# Patient Record
Sex: Male | Born: 1960 | Race: White | Hispanic: No | Marital: Single | Smoking: Current every day smoker
Health system: Southern US, Community
[De-identification: ages and names within clinical notes are randomized; demographics above are authoritative.]

## PROBLEM LIST (undated history)

## (undated) DIAGNOSIS — R944 Abnormal results of kidney function studies: Secondary | ICD-10-CM

## (undated) DIAGNOSIS — F419 Anxiety disorder, unspecified: Secondary | ICD-10-CM

## (undated) DIAGNOSIS — D126 Benign neoplasm of colon, unspecified: Secondary | ICD-10-CM

## (undated) DIAGNOSIS — I10 Essential (primary) hypertension: Secondary | ICD-10-CM

## (undated) DIAGNOSIS — T7840XA Allergy, unspecified, initial encounter: Secondary | ICD-10-CM

## (undated) DIAGNOSIS — R509 Fever, unspecified: Secondary | ICD-10-CM

## (undated) DIAGNOSIS — F1911 Other psychoactive substance abuse, in remission: Secondary | ICD-10-CM

## (undated) DIAGNOSIS — K219 Gastro-esophageal reflux disease without esophagitis: Secondary | ICD-10-CM

## (undated) DIAGNOSIS — M720 Palmar fascial fibromatosis [Dupuytren]: Secondary | ICD-10-CM

## (undated) DIAGNOSIS — N400 Enlarged prostate without lower urinary tract symptoms: Secondary | ICD-10-CM

## (undated) HISTORY — PX: OTHER SURGICAL HISTORY: SHX169

## (undated) HISTORY — PX: KNEE SURGERY: SHX244

## (undated) HISTORY — DX: Abnormal results of kidney function studies: R94.4

## (undated) HISTORY — DX: Essential (primary) hypertension: I10

## (undated) HISTORY — DX: Gastro-esophageal reflux disease without esophagitis: K21.9

## (undated) HISTORY — DX: Allergy, unspecified, initial encounter: T78.40XA

## (undated) HISTORY — DX: Fever, unspecified: R50.9

## (undated) HISTORY — DX: Benign prostatic hyperplasia without lower urinary tract symptoms: N40.0

## (undated) HISTORY — DX: Benign neoplasm of colon, unspecified: D12.6

## (undated) HISTORY — DX: Anxiety disorder, unspecified: F41.9

## (undated) HISTORY — DX: Other psychoactive substance abuse, in remission: F19.11

## (undated) HISTORY — DX: Palmar fascial fibromatosis (dupuytren): M72.0

## (undated) HISTORY — PX: MASS EXCISION: SHX2000

---
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
## 1998-07-08 ENCOUNTER — Emergency Department (HOSPITAL_COMMUNITY): Admission: EM | Admit: 1998-07-08 | Discharge: 1998-07-08 | Payer: Self-pay | Admitting: Emergency Medicine

## 2011-07-16 ENCOUNTER — Encounter: Payer: Self-pay | Admitting: Medical

## 2011-07-16 ENCOUNTER — Ambulatory Visit (INDEPENDENT_AMBULATORY_CARE_PROVIDER_SITE_OTHER): Payer: BC Managed Care – PPO | Admitting: Medical

## 2011-07-16 VITALS — BP 130/90 | HR 83 | Temp 98.1°F | Resp 20 | Ht 71.0 in | Wt 159.0 lb

## 2011-07-16 DIAGNOSIS — F17200 Nicotine dependence, unspecified, uncomplicated: Secondary | ICD-10-CM

## 2011-07-16 DIAGNOSIS — F1911 Other psychoactive substance abuse, in remission: Secondary | ICD-10-CM

## 2011-07-16 DIAGNOSIS — R030 Elevated blood-pressure reading, without diagnosis of hypertension: Secondary | ICD-10-CM

## 2011-07-16 DIAGNOSIS — F1910 Other psychoactive substance abuse, uncomplicated: Secondary | ICD-10-CM

## 2011-07-16 LAB — POCT URINALYSIS DIPSTICK
Bilirubin, UA: NEGATIVE
Leukocytes, UA: NEGATIVE
Nitrite, UA: NEGATIVE
pH, UA: 5

## 2011-07-16 NOTE — Patient Instructions (Signed)
Preventative Care for Adults, Male       REGULAR HEALTH EXAMS:  A routine yearly physical is a good way to check in with your primary care provider about your health and preventive screening. It is also an opportunity to share updates about your health and any concerns you have, and receive a thorough all-over exam.   Most health insurance companies pay for at least some preventative services.  Check with your health plan for specific coverages.  WHAT PREVENTATIVE SERVICES DO MEN NEED?  Adult men should have their weight and blood pressure checked regularly.   Men age 35 and older should have their cholesterol levels checked regularly.  Beginning at age 50 and continuing to age 75, men should be screened for colorectal cancer.  Certain people should may need continued testing until age 85.  Other cancer screening may include exams for testicular and prostate cancer.  Updating vaccinations is part of preventative care.  Vaccinations help protect against diseases such as the flu.  Lab tests are generally done as part of preventative care to screen for anemia and blood disorders, to screen for problems with the kidneys and liver, to screen for bladder problems, to check blood sugar, and to check your cholesterol level.  Preventative services generally include counseling about diet, exercise, avoiding tobacco, drugs, excessive alcohol consumption, and sexually transmitted infections.    GENERAL RECOMMENDATIONS FOR GOOD HEALTH:  Healthy diet:  Eat a variety of foods, including fruit, vegetables, animal or vegetable protein, such as meat, fish, chicken, and eggs, or beans, lentils, tofu, and grains, such as rice.  Drink plenty of water daily.  Decrease saturated fat in the diet, avoid lots of red meat, processed foods, sweets, fast foods, and fried foods.  Exercise:  Aerobic exercise helps maintain good heart health. At least 30-40 minutes of moderate-intensity exercise is recommended.  For example, a brisk walk that increases your heart rate and breathing. This should be done on most days of the week.   Find a type of exercise or a variety of exercises that you enjoy so that it becomes a part of your daily life.  Examples are running, walking, swimming, water aerobics, and biking.  For motivation and support, explore group exercise such as aerobic class, spin class, Zumba, Yoga,or  martial arts, etc.    Set exercise goals for yourself, such as a certain weight goal, walk or run in a race such as a 5k walk/run.  Speak to your primary care provider about exercise goals.  Disease prevention:  If you smoke or chew tobacco, find out from your caregiver how to quit. It can literally save your life, no matter how long you have been a tobacco user. If you do not use tobacco, never begin.   Maintain a healthy diet and normal weight. Increased weight leads to problems with blood pressure and diabetes.   The Body Mass Index or BMI is a way of measuring how much of your body is fat. Having a BMI above 27 increases the risk of heart disease, diabetes, hypertension, stroke and other problems related to obesity. Your caregiver can help determine your BMI and based on it develop an exercise and dietary program to help you achieve or maintain this important measurement at a healthful level.  High blood pressure causes heart and blood vessel problems.  Persistent high blood pressure should be treated with medicine if weight loss and exercise do not work.   Fat and cholesterol leaves deposits in your arteries   that can block them. This causes heart disease and vessel disease elsewhere in your body.  If your cholesterol is found to be high, or if you have heart disease or certain other medical conditions, then you may need to have your cholesterol monitored frequently and be treated with medication.   Ask if you should have a stress test if your history suggests this. A stress test is a test done on  a treadmill that looks for heart disease. This test can find disease prior to there being a problem.  Avoid drinking alcohol in excess (more than two drinks per day).  Avoid use of street drugs. Do not share needles with anyone. Ask for professional help if you need assistance or instructions on stopping the use of alcohol, cigarettes, and/or drugs.  Brush your teeth twice a day with fluoride toothpaste, and floss once a day. Good oral hygiene prevents tooth decay and gum disease. The problems can be painful, unattractive, and can cause other health problems. Visit your dentist for a routine oral and dental check up and preventive care every 6-12 months.   Look at your skin regularly.  Use a mirror to look at your back. Notify your caregivers of changes in moles, especially if there are changes in shapes, colors, a size larger than a pencil eraser, an irregular border, or development of new moles.  Safety:  Use seatbelts 100% of the time, whether driving or as a passenger.  Use safety devices such as hearing protection if you work in environments with loud noise or significant background noise.  Use safety glasses when doing any work that could send debris in to the eyes.  Use a helmet if you ride a bike or motorcycle.  Use appropriate safety gear for contact sports.  Talk to your caregiver about gun safety.  Use sunscreen with a SPF (or skin protection factor) of 15 or greater.  Lighter skinned people are at a greater risk of skin cancer. Don't forget to also wear sunglasses in order to protect your eyes from too much damaging sunlight. Damaging sunlight can accelerate cataract formation.   Practice safe sex. Use condoms. Condoms are used for birth control and to help reduce the spread of sexually transmitted infections (or STIs).  Some of the STIs are gonorrhea (the clap), chlamydia, syphilis, trichomonas, herpes, HPV (human papilloma virus) and HIV (human immunodeficiency virus) which causes AIDS.  The herpes, HIV and HPV are viral illnesses that have no cure. These can result in disability, cancer and death.   Keep carbon monoxide and smoke detectors in your home functioning at all times. Change the batteries every 6 months or use a model that plugs into the wall.   Vaccinations:  Stay up to date with your tetanus shots and other required immunizations. You should have a booster for tetanus every 10 years. Be sure to get your flu shot every year, since 5%-20% of the [place] population comes down with the flu. The flu vaccine changes each year, so being vaccinated once is not enough. Get your shot in the fall, before the flu season peaks.   Other vaccines to consider:  Pneumococcal vaccine to protect against certain types of pneumonia.  This is normally recommended for adults age 65 or older.  However, adults younger than 51 years old with certain underlying conditions such as diabetes, heart or lung disease should also receive the vaccine.  Shingles vaccine to protect against Varicella Zoster if you are older than age 60, or younger   than 51 years old with certain underlying illness.  Hepatitis A vaccine to protect against a form of infection of the liver by a virus acquired from food.  Hepatitis B vaccine to protect against a form of infection of the liver by a virus acquired from blood or body fluids, particularly if you work in health care.  If you plan to travel internationally, check with your local health department for specific vaccination recommendations.  Cancer Screening:  Most routine colon cancer screening begins at the age of 50. On a yearly basis, doctors may provide special easy to use take-home tests to check for hidden blood in the stool. Sigmoidoscopy or colonoscopy can detect the earliest forms of colon cancer and is life saving. These tests use a small camera at the end of a tube to directly examine the colon. Speak to your caregiver about this at age 50, when routine  screening begins (and is repeated every 5 years unless early forms of pre-cancerous polyps or small growths are found).   At the age of 50 men usually start screening for prostate cancer every year. Screening may begin at a younger age for those with higher risk. Those at higher risk include African-Americans or having a family history of prostate cancer. There are two types of tests for prostate cancer:   Prostate-specific antigen (PSA) testing. Recent studies raise questions about prostate cancer using PSA and you should discuss this with your caregiver.   Digital rectal exam (in which your doctor's lubricated and gloved finger feels for enlargement of the prostate through the anus).   Screening for testicular cancer.  Do a monthly exam of your testicles. Gently roll each testicle between your thumb and fingers, feeling for any abnormal lumps. The best time to do this is after a hot shower or bath when the tissues are looser. Notify your caregivers of any lumps, tenderness or changes in size or shape immediately.     

## 2011-07-16 NOTE — Progress Notes (Signed)
Subjective:   HPI  Ralph Riley is a 50 y.o. male who presents as a new patient.  He notes that he has not seen a doctor in years.  He is here due to recent elevations in his blood pressure.  He does notes adding salt to foods.  He notes that he has been checking his BP a few times at [employer], getting 150/100, 150/90.  He is a smoker, and he drinks alcohol 2-3 days per week.  He does note a period in 2006-2009 where he used cocaine somewhat regularly.  He has been drug free for quite some time now.  He and his family both felt it would be a good idea to have a physical and checkup on the BP.  He went to a health fair last year and BP was elevated then as well.  No other aggravating or relieving factors.  No other c/o.  He is catholic, lives at home with daughter who he has joint custody of, ages 42yo and 51yo.  The following portions of the patient's history were reviewed and updated as appropriate: allergies, current medications, past family history, past medical history, past social history, past surgical history and problem list.  Past Medical History  Diagnosis Date  . Chronic sinusitis   . Fever, unknown origin 1978    no etiology found, resolved  . Allergy   . History of substance abuse     cocaine, marijuana   Family History  Problem Relation Age of Onset  . Arthritis Mother   . COPD Mother   . Alcohol abuse Father   . Neuropathy Father   . Hypertension Father   . Hyperlipidemia Father   . Cancer Maternal Grandmother   . Cancer Maternal Grandfather   . Cancer Paternal Grandmother   . Cancer Paternal Grandfather   . Diabetes Paternal Grandfather   . Heart disease Neg Hx   . Stroke Neg Hx    Past Surgical History  Procedure Date  . Minor lacerations   . Mass excision     neck   No Known Allergies No current outpatient prescriptions on file prior to visit.    Review of Systems Constitutional: denies fever, chills, sweats, unexpected weight change, anorexia,  fatigue Allergy: no congestion, sneezing Dermatology: denies rash ENT: no runny nose, ear pain, sore throat, hoarseness, sinus pain Cardiology: denies chest pain, palpitations, edema Respiratory: denies cough, shortness of breath, wheezing Gastroenterology: denies abdominal pain, nausea, vomiting, diarrhea, constipation, Hematology: denies bleeding or bruising problems Musculoskeletal: +joint pains; denies myalgias, joint swelling, back pain Ophthalmology: denies vision changes Urology: denies dysuria, difficulty urinating, hematuria, urinary frequency, urgency Neurology: no headache, weakness, tingling, numbness Psychology: +anxiety; denies depressed mood     Objective:   Physical Exam  General appearance: alert, no distress, WD/WN, white male HEENT: normocephalic, sclerae anicteric, PERRLA, EOMi, nares patent, no discharge or erythema, pharynx normal Oral cavity: MMM, no lesions Neck: supple, no lymphadenopathy, no thyromegaly, no masses, no JVD or bruits Heart: RRR, normal S1, S2, no murmurs Lungs: CTA bilaterally, no wheezes, rhonchi, or rales Abdomen: +bs, soft, non tender, non distended, no masses, no hepatomegaly, no splenomegaly Extremities: no edema, no cyanosis, no clubbing Pulses: 2+ symmetric, upper and lower extremities, normal cap refill Neurological: alert, oriented x 3, CN2-12 intact, strength normal upper extremities and lower extremities, sensation normal throughout, DTRs 2+ throughout, no cerebellar signs, gait normal    Assessment :    Encounter Diagnoses  Name Primary?  . Elevated blood pressure (  not hypertension) Yes  . History of substance abuse   . Tobacco use disorder       Plan:    Elevated BP - he will return for fasting labs.  EKG today with nsr, rate 65 bpm, indication elevated BP, risk factors: elevated BP, hx/o polysubstance abuse, male, smoker, normal EKG.  Urinalysis today normal.  Advised he keep BP log, return this for recheck on BP in 2  wk.  Hx/o polysubstance abuse - advised he limit alcohol to 2 drinks daily, stop smoking.  Given the recent elevated BP, EKG today, and he will return for labs.   Tobacco use disorder - advised of dangers of tobacco use. He has tried electronic cigarette.  He will work on stopping.    Recommended prostate exam and colonoscopy screening.  He declines, despite the risks.

## 2011-07-19 ENCOUNTER — Other Ambulatory Visit: Payer: BC Managed Care – PPO

## 2011-07-19 DIAGNOSIS — R030 Elevated blood-pressure reading, without diagnosis of hypertension: Secondary | ICD-10-CM

## 2011-07-19 LAB — CBC WITH DIFFERENTIAL/PLATELET
Basophils Relative: 0 % (ref 0–1)
Eosinophils Absolute: 0.2 10*3/uL (ref 0.0–0.7)
HCT: 50.3 % (ref 39.0–52.0)
Hemoglobin: 17.3 g/dL — ABNORMAL HIGH (ref 13.0–17.0)
Lymphs Abs: 2 10*3/uL (ref 0.7–4.0)
MCH: 30.5 pg (ref 26.0–34.0)
MCHC: 34.4 g/dL (ref 30.0–36.0)
Monocytes Absolute: 0.9 10*3/uL (ref 0.1–1.0)
Monocytes Relative: 9 % (ref 3–12)
Neutro Abs: 7.7 10*3/uL (ref 1.7–7.7)
RBC: 5.68 MIL/uL (ref 4.22–5.81)

## 2011-07-20 ENCOUNTER — Telehealth: Payer: Self-pay | Admitting: *Deleted

## 2011-07-20 LAB — LIPID PANEL
Cholesterol: 128 mg/dL (ref 0–200)
LDL Cholesterol: 57 mg/dL (ref 0–99)
Total CHOL/HDL Ratio: 3 Ratio
Triglycerides: 143 mg/dL (ref <150)
VLDL: 29 mg/dL (ref 0–40)

## 2011-07-20 LAB — COMPREHENSIVE METABOLIC PANEL
ALT: 19 U/L (ref 0–53)
AST: 17 U/L (ref 0–37)
Creat: 1.03 mg/dL (ref 0.50–1.35)
Total Bilirubin: 0.8 mg/dL (ref 0.3–1.2)

## 2011-07-20 NOTE — Telephone Encounter (Addendum)
Message copied by Dorthula Perfect on Fri Jul 20, 2011  4:46 PM ------      Message from: Aleen Campi, DAVID S      Created: Fri Jul 20, 2011  1:01 PM       His labs showed a few minor abnormalities, otherwise overall looks pretty good.  Have him keep a record of his blood pressure readings and recheck in 2 weeks as discussed. We will go over labs and blood pressure at that time and see if he needs to start medication.  Pt notified of lab results.  Pt scheduled for a follow up on 07-30-11 with JCL.  CM, LPN

## 2011-07-30 ENCOUNTER — Ambulatory Visit (INDEPENDENT_AMBULATORY_CARE_PROVIDER_SITE_OTHER): Payer: BC Managed Care – PPO | Admitting: Family Medicine

## 2011-07-30 ENCOUNTER — Encounter: Payer: Self-pay | Admitting: Family Medicine

## 2011-07-30 VITALS — BP 150/90 | HR 78 | Wt 159.0 lb

## 2011-07-30 DIAGNOSIS — Z23 Encounter for immunization: Secondary | ICD-10-CM

## 2011-07-30 DIAGNOSIS — I10 Essential (primary) hypertension: Secondary | ICD-10-CM

## 2011-07-30 MED ORDER — HYDROCHLOROTHIAZIDE 12.5 MG PO CAPS: 12.5000 mg | ORAL_CAPSULE | Freq: Every day | ORAL | Status: DC

## 2011-07-30 NOTE — Progress Notes (Signed)
  Subjective:    Patient ID: Ralph Riley, male    DOB: 01-17-1961, 50 y.o.   MRN: [id number]  HPI He is here for blood pressure recheck. He did bring his readings in. The numbers are in the room 150/100 range.   Review of Systems     Objective:   Physical Exam Alert and in no distress otherwise not examined       Assessment & Plan:  Hypertension. I had a long discussion with him concerning the need for him to continue with diet, exercise, cut back on smoking and drinking. Will place him on HCTZ and recheck in one month.

## 2011-08-08 ENCOUNTER — Telehealth: Payer: Self-pay | Admitting: Family Medicine

## 2011-08-08 NOTE — Telephone Encounter (Signed)
Called pt to let him know it is best to ask a pharmacist left message on phone

## 2011-08-27 ENCOUNTER — Encounter: Payer: Self-pay | Admitting: Family Medicine

## 2011-08-27 ENCOUNTER — Ambulatory Visit (INDEPENDENT_AMBULATORY_CARE_PROVIDER_SITE_OTHER): Payer: BC Managed Care – PPO | Admitting: Family Medicine

## 2011-08-27 VITALS — BP 138/98 | HR 86 | Wt 158.0 lb

## 2011-08-27 DIAGNOSIS — Z1211 Encounter for screening for malignant neoplasm of colon: Secondary | ICD-10-CM

## 2011-08-27 DIAGNOSIS — I10 Essential (primary) hypertension: Secondary | ICD-10-CM | POA: Insufficient documentation

## 2011-08-27 DIAGNOSIS — J019 Acute sinusitis, unspecified: Secondary | ICD-10-CM

## 2011-08-27 MED ORDER — AMOXICILLIN 875 MG PO TABS: 875.0000 mg | ORAL_TABLET | Freq: Two times a day (BID) | ORAL | Status: AC

## 2011-08-27 MED ORDER — LISINOPRIL-HYDROCHLOROTHIAZIDE 10-12.5 MG PO TABS: 1.0000 | ORAL_TABLET | Freq: Every day | ORAL | Status: DC

## 2011-08-27 NOTE — Patient Instructions (Signed)
Take all the antibiotic and if not entirely back to normal call me 1 back here in one month for blood pressure recheck

## 2011-08-27 NOTE — Progress Notes (Signed)
  Subjective:    Patient ID: Ralph Riley, male    DOB: 1961/04/20, 50 y.o.   MRN: [id number]  HPI He is here for blood pressure recheck. He also has a three-week history that started with sinus congestion number PND but no sore throat, earache or chest congestion. He did progress to some chest congestion and coughing as well as hoarse voice. Now he is mainly having sinus congestion with hoarse voice. He does smoke. He does have spring and fall allergies   Review of Systems     Objective:   Physical Exam alert and in no distress. Tympanic membranes and canals are normal. Throat is clear. Tonsils are normal. Neck is supple without adenopathy or thyromegaly. Cardiac exam shows a regular sinus rhythm without murmurs or gallops. Lungs are clear to auscultation. Nasal mucosa is red with some tenderness over maxillary sinuses        Assessment & Plan:   1. Hypertension   2. Sinusitis acute    Switch to lisinopril. We'll also give Amoxil. Recheck here one month He will also be set up for a colonoscopy

## 2011-09-24 ENCOUNTER — Ambulatory Visit (INDEPENDENT_AMBULATORY_CARE_PROVIDER_SITE_OTHER): Payer: BC Managed Care – PPO | Admitting: Family Medicine

## 2011-09-24 ENCOUNTER — Encounter: Payer: Self-pay | Admitting: Family Medicine

## 2011-09-24 ENCOUNTER — Ambulatory Visit: Payer: BC Managed Care – PPO | Admitting: Family Medicine

## 2011-09-24 VITALS — BP 120/80 | HR 100 | Wt 158.0 lb

## 2011-09-24 DIAGNOSIS — I10 Essential (primary) hypertension: Secondary | ICD-10-CM

## 2011-09-24 DIAGNOSIS — J040 Acute laryngitis: Secondary | ICD-10-CM

## 2011-09-24 DIAGNOSIS — J209 Acute bronchitis, unspecified: Secondary | ICD-10-CM

## 2011-09-24 DIAGNOSIS — F17200 Nicotine dependence, unspecified, uncomplicated: Secondary | ICD-10-CM

## 2011-09-24 MED ORDER — AMOXICILLIN-POT CLAVULANATE 875-125 MG PO TABS: 1.0000 | ORAL_TABLET | Freq: Two times a day (BID) | ORAL | Status: DC

## 2011-09-24 NOTE — Patient Instructions (Signed)
Take the antibiotic until it is gone. Hold taking the lisinopril for the next 10 days also. Call me when you finish the antibiotic

## 2011-09-24 NOTE — Progress Notes (Signed)
  Subjective:    Patient ID: Ralph Riley, male    DOB: 1961/10/16, 50 y.o.   MRN: [id number]  HPI He is here for consult concerning his blood pressure. He is now on lisinopril and having no problem with this. He continues to have difficulty with cough and hoarse voice it gets worse as the day goes on. He thinks the antibiotic has helped him roughly 75%. He's had no earache, sore throat, fever or chills. He does continue to smoke. He does not think that starting the lisinopril has had no effect on his cough.   Review of Systems     Objective:   Physical Exam alert and in no distress. Tympanic membranes and canals are normal. Throat is clear. Tonsils are normal. Neck is supple without adenopathy or thyromegaly. Cardiac exam shows a regular sinus rhythm without murmurs or gallops. Lungs are clear to auscultation.        Assessment & Plan:   1. Hypertension   2. Laryngitis acute   3. Bronchitis, acute   4. Current smoker    I will give him Augmentin. He is to hold his lisinopril for the next week. Discussed smoking cessation with him. He is close to want to quit again. He will call me at the end of 10 days. He will return here for smoking consultation when he is truly ready to quit.

## 2011-10-05 ENCOUNTER — Telehealth: Payer: Self-pay | Admitting: Family Medicine

## 2011-10-05 MED ORDER — AMOXICILLIN-POT CLAVULANATE 875-125 MG PO TABS: 1.0000 | ORAL_TABLET | Freq: Two times a day (BID) | ORAL | Status: DC

## 2011-10-05 NOTE — Telephone Encounter (Signed)
He states that he is at least 90% better but still having some PND and coughing. I will give him another 10 days worth of Augmentin. He will call me if he has further difficulties.

## 2011-10-17 ENCOUNTER — Telehealth: Payer: Self-pay | Admitting: Family Medicine

## 2011-10-17 NOTE — Telephone Encounter (Signed)
Pt notified that he can start back on bp med

## 2011-10-17 NOTE — Telephone Encounter (Signed)
He can start back on his medication.

## 2011-10-24 ENCOUNTER — Encounter: Payer: Self-pay | Admitting: Family Medicine

## 2011-10-24 ENCOUNTER — Ambulatory Visit (INDEPENDENT_AMBULATORY_CARE_PROVIDER_SITE_OTHER): Payer: BC Managed Care – PPO | Admitting: Family Medicine

## 2011-10-24 VITALS — BP 112/80 | HR 88 | Temp 97.8°F | Resp 16 | Wt 158.0 lb

## 2011-10-24 DIAGNOSIS — J019 Acute sinusitis, unspecified: Secondary | ICD-10-CM

## 2011-10-24 DIAGNOSIS — F17200 Nicotine dependence, unspecified, uncomplicated: Secondary | ICD-10-CM

## 2011-10-24 MED ORDER — AMOXICILLIN-POT CLAVULANATE 875-125 MG PO TABS: 1.0000 | ORAL_TABLET | Freq: Two times a day (BID) | ORAL | Status: AC

## 2011-10-24 NOTE — Progress Notes (Signed)
  Subjective:    Patient ID: Ralph Riley, male    DOB: October 19, 1961, 50 y.o.   MRN: [id number]  HPI He finished the antibiotic approximately one week ago and states that he felt 98% better. Since that time his symptoms have reoccurred with PND, hoarse voice, dry cough, nasal congestion. He does have seasonal sinus changes and brother changes but states that the symptoms are different than the one she is presently having. He also notes increase in his dry cough when he lies down however when he gets up in the morning he is having no difficulty with that. He continues to smoke.   Review of Systems     Objective:   Physical Exam alert and in no distress. Tympanic membranes and canals are normal. Throat is clear. Tonsils are normal. Neck is supple without adenopathy or thyromegaly. Cardiac exam shows a regular sinus rhythm without murmurs or gallops. Lungs are clear to auscultation. Nasal mucosa is red with some tenderness or maxillary sinuses        Assessment & Plan:  Current smoker. Again strongly encouraged him to quit smoking 1. Sinusitis acute  CT MAXILLOFACIAL LTD WO CM   I will also place him back on Augmentin and given a Sterapred Dosepak. Followup pending results of CT scan I discussed reflux playing a role in this as well as allergies.

## 2011-10-29 ENCOUNTER — Ambulatory Visit
Admission: RE | Admit: 2011-10-29 | Discharge: 2011-10-29 | Disposition: A | Discharge status: Home / Self Care | Payer: BC Managed Care – PPO | Source: Ambulatory Visit | Attending: Family Medicine | Admitting: Family Medicine

## 2011-10-29 DIAGNOSIS — J019 Acute sinusitis, unspecified: Secondary | ICD-10-CM

## 2011-11-07 ENCOUNTER — Telehealth: Payer: Self-pay | Admitting: Family Medicine

## 2011-11-07 MED ORDER — AMOXICILLIN-POT CLAVULANATE 875-125 MG PO TABS: 1.0000 | ORAL_TABLET | Freq: Two times a day (BID) | ORAL | Status: AC

## 2011-11-07 NOTE — Telephone Encounter (Signed)
PT CALLED AND STATED THAT HE IS FINISHED AUGMENTON AND IS STILL HAVING ISSUES WITH SINUSES. PT STATES THAT HE THINKS THAT THE PREDNISONE HELPED THE MOST. PT USES [facility] ON [address].

## 2011-11-07 NOTE — Telephone Encounter (Signed)
Let him know that I did call in the Augmentin but steroids would not be appropriate. He is to return here if he continues to have difficulty at the end of this course of antibiotic

## 2011-11-07 NOTE — Telephone Encounter (Signed)
Augmentin called in for continued difficulty with sinus disease.

## 2011-11-08 NOTE — Telephone Encounter (Signed)
Pt informed

## 2011-11-19 ENCOUNTER — Telehealth: Payer: Self-pay | Admitting: Family Medicine

## 2011-11-19 NOTE — Telephone Encounter (Signed)
There is not a more aggressive antibiotic. Give him another 10 days worth and if he still having systems, he needs to come in

## 2011-11-19 NOTE — Telephone Encounter (Signed)
Called med in per jcl 

## 2011-11-19 NOTE — Telephone Encounter (Signed)
Called augment in [phone] refill

## 2011-12-18 ENCOUNTER — Ambulatory Visit (INDEPENDENT_AMBULATORY_CARE_PROVIDER_SITE_OTHER): Payer: BC Managed Care – PPO | Admitting: Family Medicine

## 2011-12-18 ENCOUNTER — Encounter: Payer: Self-pay | Admitting: Family Medicine

## 2011-12-18 VITALS — BP 120/84 | HR 72 | Temp 97.8°F | Wt 161.0 lb

## 2011-12-18 DIAGNOSIS — F17200 Nicotine dependence, unspecified, uncomplicated: Secondary | ICD-10-CM | POA: Insufficient documentation

## 2011-12-18 DIAGNOSIS — J329 Chronic sinusitis, unspecified: Secondary | ICD-10-CM

## 2011-12-18 MED ORDER — LEVOFLOXACIN 500 MG PO TABS: 500.0000 mg | ORAL_TABLET | Freq: Every day | ORAL | Status: AC

## 2011-12-18 MED ORDER — PREDNISONE 10 MG PO KIT: PACK | ORAL | Status: DC

## 2011-12-18 NOTE — Progress Notes (Signed)
  Subjective:    Patient ID: Ralph Riley, male    DOB: December 15, 1960, 51 y.o.   MRN: [id number]  HPI He is here for a consultation. He is again having difficulty with sinus congestion, PND. He is also involved in a smoking cessation program. He has had difficulty over the last several months with sinusitis type symptoms. He did respond quite nicely to steroids as well as an antibiotic however the symptoms have recurred.   Review of Systems     Objective:   Physical Exam alert and in no distress. Tympanic membranes and canals are normal. Throat is clear. Tonsils are normal. Neck is supple without adenopathy or thyromegaly. Cardiac exam shows a regular sinus rhythm without murmurs or gallops. Lungs are clear to auscultation. Nasal mucosa is red with tenderness over sinuses        Assessment & Plan:   1. Sinusitis, chronic   2. Current smoker    he would like to be placed back on steroids which I will do but told him is reluctant to do this. Also given Levaquin. We talked at length concerning smoking cessation. He will try gum. We discussed the use of Wellbutrin and I will use this if he is unsuccessful with the gum.

## 2011-12-18 NOTE — Patient Instructions (Signed)
Take all the antibiotic and if there is any question about cure, get refill

## 2012-09-25 ENCOUNTER — Other Ambulatory Visit: Payer: Self-pay | Admitting: Family Medicine

## 2012-10-27 ENCOUNTER — Other Ambulatory Visit: Payer: Self-pay

## 2012-10-27 MED ORDER — LISINOPRIL-HYDROCHLOROTHIAZIDE 10-12.5 MG PO TABS: 1.0000 | ORAL_TABLET | Freq: Every day | ORAL | Status: DC

## 2012-10-27 NOTE — Telephone Encounter (Signed)
Sent in pt B/P med

## 2013-01-17 IMAGING — CT CT PARANASAL SINUSES LIMITED
1 series · 11 of 13 positions shown, 14 images · non-contrast
Comparison: None

CLINICAL DATA: Chronic sinusitis

CT LIMITED SINUSES WITHOUT CONTRAST
TECHNIQUE: Multidetector CT images of the paranasal sinuses were
obtained in a single plane without contrast.

[Series 3: coronal soft · axial · 0.38mm/px · z∈[+30,+130]mm · 11 of 13 slices shown, 14 images]
[im 2/13  brain]
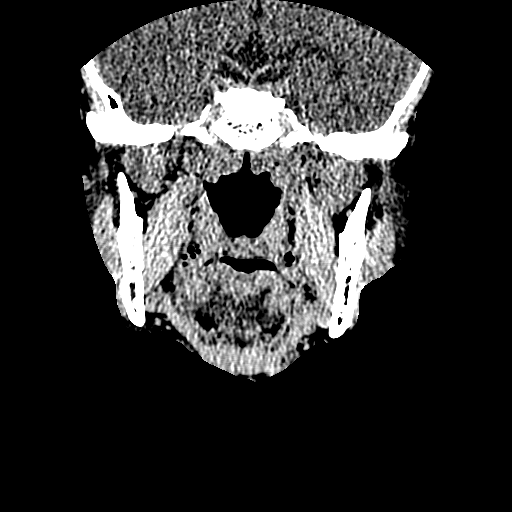
[im 2/13  bone]
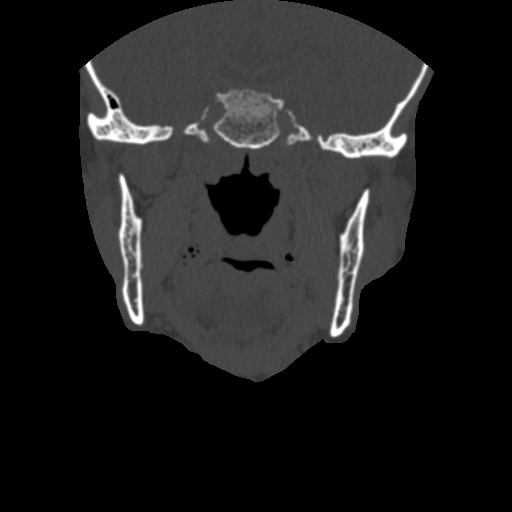
[im 3/13  bone]
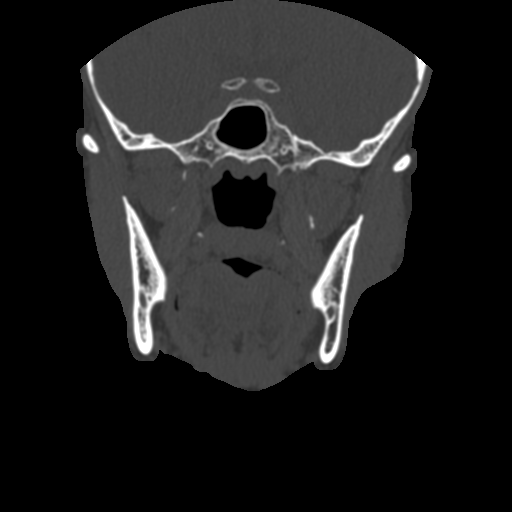
[im 4/13  bone]
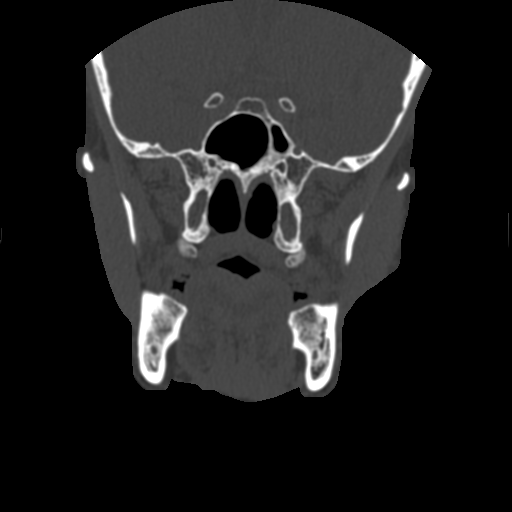
[im 5/13  bone]
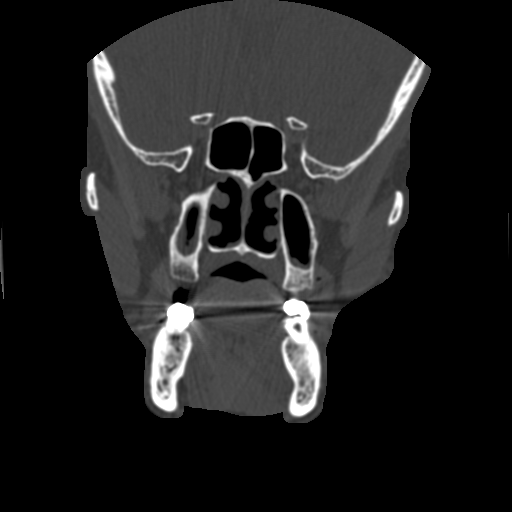
[im 6/13  brain]
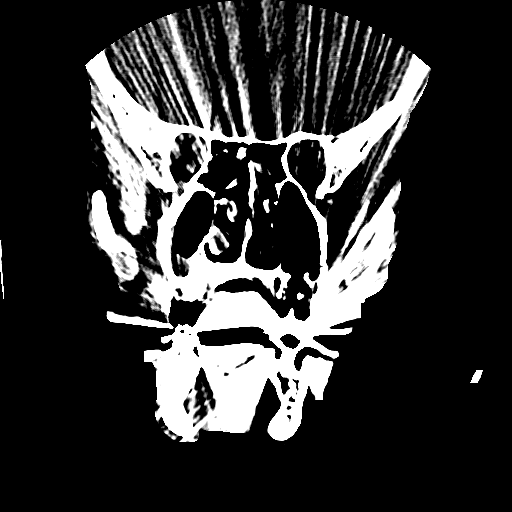
[im 6/13  bone]
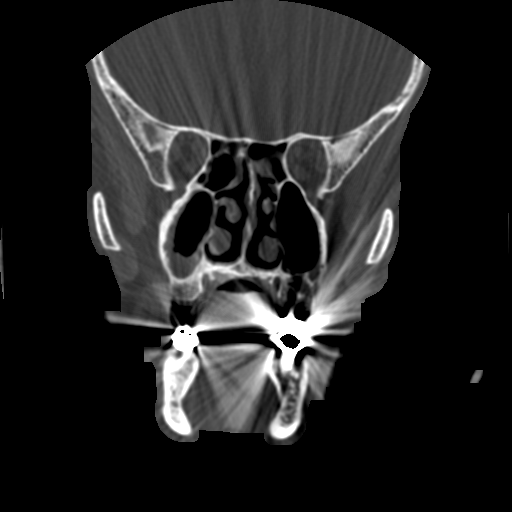
[im 7/13  bone]
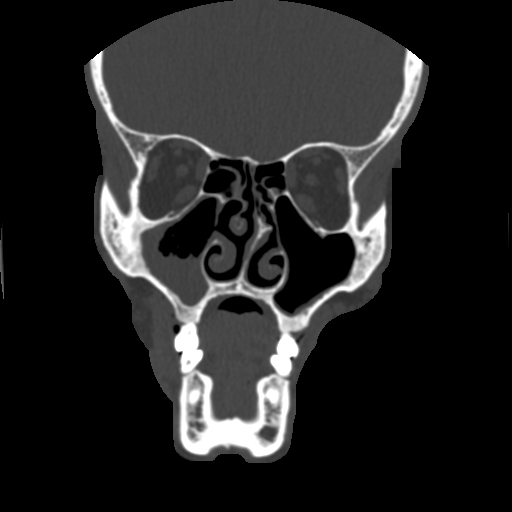
[im 8/13  bone]
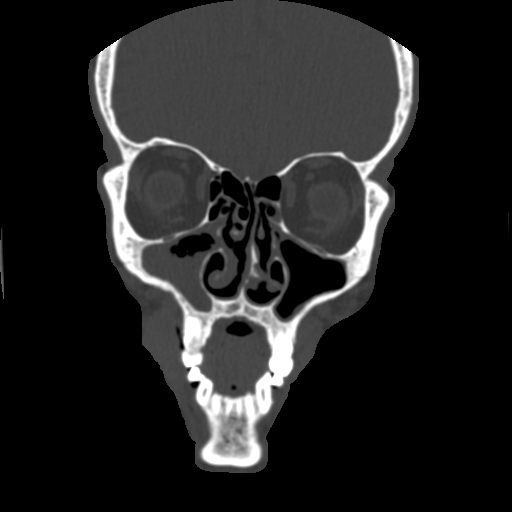
[im 9/13  bone]
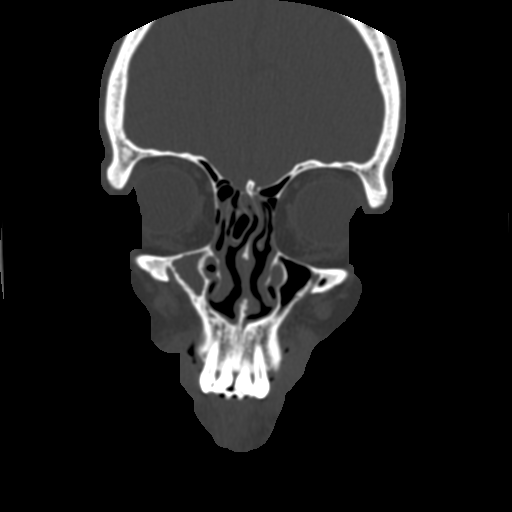
[im 10/13  brain]
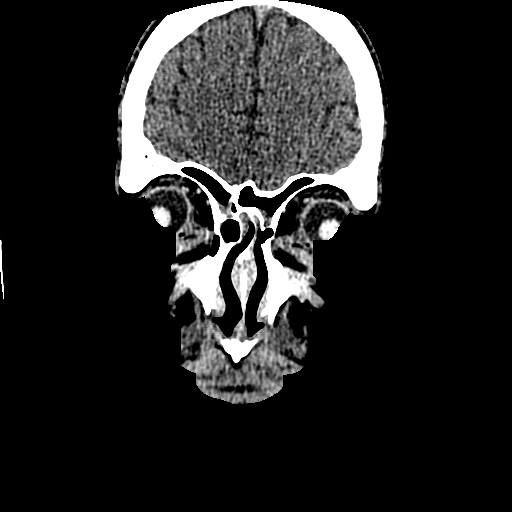
[im 10/13  bone]
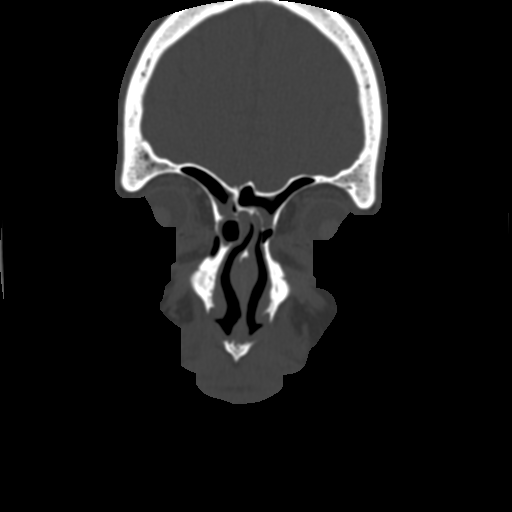
[im 11/13  bone]
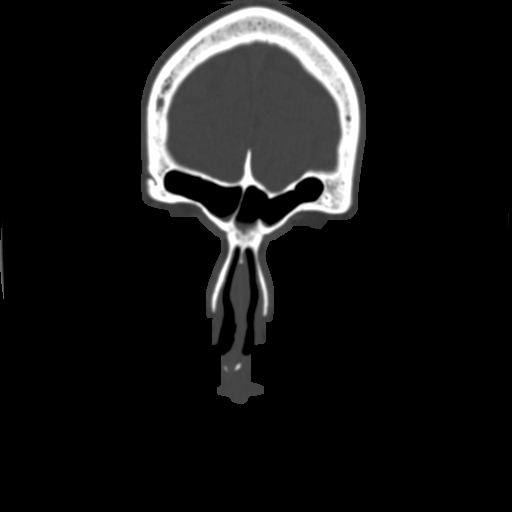
[im 12/13  bone]
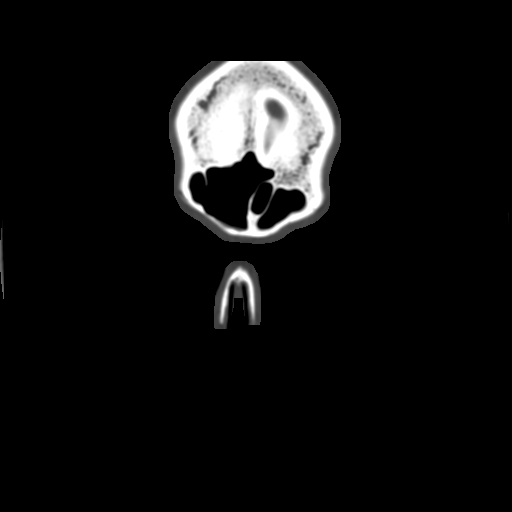

[11 of 13 positions shown; findings below may reference images not displayed]

FINDINGS: Extensive mucosal thickening left maxillary sinus with
associated air-fluid level on the left.  Mild mucosal edema in the
right maxillary sinus with narrowing of the ostium due to mucosal
edema.  There is mucosal edema in the frontal and ethmoid sinuses
bilaterally.  Sphenoid sinus is clear.

Extensive nasal septal deviation to the right.  Left superior
turbinate is pneumatized.

No acute bony abnormality.  No soft tissue mass.
IMPRESSION: Acute and chronic sinusitis with air-fluid level left maxillary
sinus.

## 2013-02-26 ENCOUNTER — Other Ambulatory Visit: Payer: Self-pay | Admitting: Family Medicine

## 2013-03-25 ENCOUNTER — Other Ambulatory Visit: Payer: Self-pay | Admitting: Family Medicine

## 2022-10-12 ENCOUNTER — Ambulatory Visit (HOSPITAL_COMMUNITY)
Admission: EM | Admit: 2022-10-12 | Discharge: 2022-10-12 | Disposition: A | Discharge status: Home / Self Care | Payer: 59

## 2022-10-12 DIAGNOSIS — Z1330 Encounter for screening examination for mental health and behavioral disorders, unspecified: Secondary | ICD-10-CM

## 2022-10-12 NOTE — BH Assessment (Addendum)
Lilian Kapur, Routine: 61 year old presents to [facility] voluntarily and accompanied with his sister, Eldridge Dace, [phone] and daughter, Terrius Gentile, [phone].  Pt denies SI, HI or AVH.  Pt family reports  "he have not been making clear decisions and he needs an psychiatric evaluation".  Pt family reports he invited a homeless person into his home; also have given $5000.00 dollars to the homeless person, "I met him at a bar [employer]), he was talking about Jesus [employer], I believe in [place] and he continue to ask for money, he needed some help".  Pt admits to drinking beer and smoking marijuana daily, " it helps me to relax and sleep".  Pt family reports that he is missing one of his cars.  Pt denies any prior MH diagnosis or prescribed medication for symptom management.  MSE signed by patient.
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
## 2022-10-12 NOTE — Discharge Instructions (Addendum)

## 2022-10-24 ENCOUNTER — Other Ambulatory Visit: Payer: Self-pay | Admitting: Family Medicine

## 2022-10-24 DIAGNOSIS — F03B18 Unspecified dementia, moderate, with other behavioral disturbance: Secondary | ICD-10-CM

## 2022-12-06 ENCOUNTER — Ambulatory Visit
Admission: RE | Admit: 2022-12-06 | Discharge: 2022-12-06 | Disposition: A | Discharge status: Home / Self Care | Payer: Managed Care, Other (non HMO) | Source: Ambulatory Visit | Attending: Family Medicine | Admitting: Family Medicine

## 2022-12-06 DIAGNOSIS — F03B18 Unspecified dementia, moderate, with other behavioral disturbance: Secondary | ICD-10-CM

## 2022-12-06 MED ORDER — GADOPICLENOL 0.5 MMOL/ML IV SOLN
7.5000 mL | Freq: Once | INTRAVENOUS | Status: AC | PRN
  Administered 2022-12-06: 7.5 mL via INTRAVENOUS

## 2022-12-18 ENCOUNTER — Ambulatory Visit (INDEPENDENT_AMBULATORY_CARE_PROVIDER_SITE_OTHER): Payer: Managed Care, Other (non HMO) | Admitting: Psychiatry

## 2022-12-18 VITALS — BP 111/68 | HR 72 | Ht 70.0 in | Wt 175.6 lb

## 2022-12-18 DIAGNOSIS — R413 Other amnesia: Secondary | ICD-10-CM | POA: Diagnosis not present
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
## 2022-12-19 ENCOUNTER — Telehealth: Payer: Self-pay | Admitting: Psychiatry

## 2022-12-19 NOTE — Telephone Encounter (Signed)
Psych referral sent to Dr. Otto Herb office, phone # [phone].

## 2022-12-21 LAB — TSH: TSH: 1.06 u[IU]/mL (ref 0.450–4.500)

## 2022-12-21 LAB — VITAMIN B12: Vitamin B-12: 514 pg/mL (ref 232–1245)

## 2022-12-21 LAB — VITAMIN B1: Thiamine: 97.9 nmol/L (ref 66.5–200.0)

## 2023-07-23 ENCOUNTER — Ambulatory Visit: Payer: Managed Care, Other (non HMO) | Admitting: Psychiatry

## 2023-09-03 ENCOUNTER — Other Ambulatory Visit: Payer: Self-pay | Admitting: Family Medicine

## 2023-09-03 DIAGNOSIS — I10 Essential (primary) hypertension: Secondary | ICD-10-CM

## 2023-09-17 ENCOUNTER — Other Ambulatory Visit: Payer: Self-pay | Admitting: Family Medicine

## 2023-09-17 DIAGNOSIS — Z122 Encounter for screening for malignant neoplasm of respiratory organs: Secondary | ICD-10-CM

## 2023-09-24 ENCOUNTER — Ambulatory Visit
Admission: RE | Admit: 2023-09-24 | Discharge: 2023-09-24 | Disposition: A | Discharge status: Home / Self Care | Payer: No Typology Code available for payment source | Source: Ambulatory Visit | Attending: Family Medicine | Admitting: Family Medicine

## 2023-09-24 DIAGNOSIS — I10 Essential (primary) hypertension: Secondary | ICD-10-CM

## 2023-10-15 ENCOUNTER — Inpatient Hospital Stay
Admission: RE | Admit: 2023-10-15 | Discharge: 2023-10-15 | Disposition: A | Discharge status: Home / Self Care | Payer: Medicaid Other | Source: Ambulatory Visit | Attending: Family Medicine | Admitting: Family Medicine

## 2023-10-15 DIAGNOSIS — Z122 Encounter for screening for malignant neoplasm of respiratory organs: Secondary | ICD-10-CM

## 2024-01-20 ENCOUNTER — Ambulatory Visit: Payer: Medicaid Other | Admitting: Internal Medicine

## 2024-03-02 ENCOUNTER — Encounter: Payer: Self-pay | Admitting: Cardiology

## 2024-03-02 ENCOUNTER — Ambulatory Visit: Payer: Medicaid Other | Attending: Cardiology | Admitting: Cardiology

## 2024-03-02 VITALS — BP 102/56 | HR 78 | Resp 16 | Ht 70.0 in | Wt 175.2 lb

## 2024-03-02 DIAGNOSIS — R931 Abnormal findings on diagnostic imaging of heart and coronary circulation: Secondary | ICD-10-CM

## 2024-03-02 DIAGNOSIS — I10 Essential (primary) hypertension: Secondary | ICD-10-CM

## 2024-03-02 DIAGNOSIS — E782 Mixed hyperlipidemia: Secondary | ICD-10-CM | POA: Diagnosis not present

## 2024-03-02 NOTE — Progress Notes (Signed)
 Cardiology Office Note:  .   Date:  03/02/2024  ID:  Ralph Riley, DOB [birth year], MRN [id number] PCP: Henrine Screws, MD  [facility] Providers Cardiologist:  Truett Mainland, MD PCP: Henrine Screws, MD  Chief Complaint  Patient presents with   Hypertension   New Patient (Initial Visit)     Ralph Riley is a 63 y.o. male with hypertension, poorly onset dementia, nicotine dependence, elevated coronary calcium score  Discussed the use of AI scribe software for clinical note transcription with the patient, who gave verbal consent to proceed.  History of Present Illness Patient is currently not working since diagnosis of early onset dementia.  He bowls regularly, plays table tennis and pickleball occasionally. He denies any complains of chest pain or shortness of breath.  Blood pressure is controlled.  Reviewed lipid panel results from 08/2023.  He smokes 1 pack/day, drinks 2-3 beers every night along with occasional marijuana.     Vitals:   03/02/24 1339  BP: (!) 102/56  Pulse: 78  Resp: 16  SpO2: 95%      Review of Systems  Cardiovascular:  Negative for chest pain, dyspnea on exertion, leg swelling, palpitations and syncope.      Studies Reviewed: Marland Kitchen        Independently interpreted 08/2023: Chol 167, TG 394, HDL 37, LDL 69 Hb 17.3 Cr 4.09, K 4.9 TSH 1.0  CT cardiac scoring 09/2023: Left Main: 0 LAD: 37 LCx: 0 RCA/PDA: 0   Total Agatston Score: 37 MESA database percentile: 45th  Normal caliber aorta Aortic atherosclerosis  Emphysema     Physical Exam Vitals and nursing note reviewed.  Constitutional:      General: He is not in acute distress. Neck:     Vascular: No JVD.  Cardiovascular:     Rate and Rhythm: Normal rate and regular rhythm.     Heart sounds: Normal heart sounds. No murmur heard. Pulmonary:     Effort: Pulmonary effort is normal.     Breath sounds: Normal breath sounds. No wheezing or rales.  Musculoskeletal:      Right lower leg: No edema.     Left lower leg: No edema.      VISIT DIAGNOSES:   ICD-10-CM   1. Primary hypertension  I10 EKG 12-Lead    2. Elevated coronary artery calcium score  R93.1 Myocardial Perfusion Imaging    3. Mixed hyperlipidemia  E78.2 Lipid panel       Ralph Riley is a 63 y.o. male with hypertension, poorly onset dementia, nicotine dependence, elevated coronary calcium score  Assessment & Plan Coronary artery calcification Coronary artery calcification is present without current symptoms of angina or dyspnea. Blood pressure is well-controlled with lisinopril. He is physically active with activities like bowling and ping pong, and plans to start pickleball. An exercise stress test is recommended to assess for obstructive CAD before increasing physical activity. - Order exercise stress test to assess for significant blockages. - Encourage smoking cessation to reduce cardiovascular risk.  Mixed hyperlipidemia: Previous lipid panel showed hypertriglyceridemia, possibly due to non-fasting state. Current fasting state allows for accurate lipid assessment. A fasting lipid panel will determine the need for statin therapy. - Order fasting lipid panel to reassess triglyceride and cholesterol levels. - Determine the need and dose of statin therapy based on lipid panel results.  Hypertension: Hypertension is well-controlled with lisinopril.  Dementia: Diagnosed with early onset dementia within the last year. Management details are unclear. He is not currently working, lives with  his first ex-wife, and has power of attorney assigned to his daughter and sister.    Informed Consent   Shared Decision Making/Informed Consent The risks [chest pain, shortness of breath, cardiac arrhythmias, dizziness, blood pressure fluctuations, myocardial infarction, stroke/transient ischemic attack, nausea, vomiting, allergic reaction, radiation exposure, metallic taste sensation and  life-threatening complications (estimated to be 1 in 10,000)], benefits (risk stratification, diagnosing coronary artery disease, treatment guidance) and alternatives of a nuclear stress test were discussed in detail with Mr. Adrien Shankar and he agrees to proceed.       F/u as needed  Signed, Elder Negus, MD

## 2024-03-02 NOTE — Patient Instructions (Signed)
 LABS: Fasting lipid panel    Testing/Procedures: EXERCISE NUCLEAR STRESS TEST   Follow-Up: At [facility], you and your health needs are our priority.  As part of our continuing mission to provide you with exceptional heart care, our providers are all part of one team.  This team includes your primary Cardiologist (physician) and Advanced Practice Providers or APPs (Physician Assistants and Nurse Practitioners) who all work together to provide you with the care you need, when you need it.  Your next appointment:   PRN  Provider:   Elder Negus, MD     We recommend signing up for the patient portal called "MyChart".  Sign up information is provided on this After Visit Summary.  MyChart is used to connect with patients for Virtual Visits (Telemedicine).  Patients are able to view lab/test results, encounter notes, upcoming appointments, etc.  Non-urgent messages can be sent to your provider as well.   To learn more about what you can do with MyChart, go to [email or web address].   Other Instructions       1st Floor: - Lobby - Registration  - Pharmacy  - Lab - Cafe  2nd Floor: - PV Lab - Diagnostic Testing (echo, CT, nuclear med)  3rd Floor: - Vacant  4th Floor: - TCTS (cardiothoracic surgery) - AFib Clinic - Structural Heart Clinic - Vascular Surgery  - Vascular Ultrasound  5th Floor: - [facility] (general and EP) - Clinical Pharmacy for coumadin, hypertension, lipid, weight-loss medications, and med management appointments    Valet parking services will be available as well.

## 2024-03-03 ENCOUNTER — Telehealth (HOSPITAL_COMMUNITY): Payer: Self-pay

## 2024-03-03 NOTE — Telephone Encounter (Signed)
 Spoke to the patient, detailed instructions given. He stated that he would be here for his test. Asked to call back with any questions. S.Willliams CCT

## 2024-03-10 ENCOUNTER — Ambulatory Visit (HOSPITAL_COMMUNITY)

## 2024-03-10 ENCOUNTER — Encounter (HOSPITAL_COMMUNITY): Payer: Self-pay

## 2024-06-10 ENCOUNTER — Encounter: Payer: Self-pay | Admitting: Cardiology

## 2024-07-06 ENCOUNTER — Telehealth (HOSPITAL_COMMUNITY): Payer: Self-pay

## 2024-07-06 NOTE — Telephone Encounter (Signed)
 Detailed instructions left on the patient's answering machine. S.Jassiel Flye CCT

## 2024-07-10 ENCOUNTER — Other Ambulatory Visit: Payer: Self-pay | Admitting: Cardiology

## 2024-07-10 DIAGNOSIS — R931 Abnormal findings on diagnostic imaging of heart and coronary circulation: Secondary | ICD-10-CM

## 2024-07-14 ENCOUNTER — Ambulatory Visit (HOSPITAL_COMMUNITY)
Admission: RE | Admit: 2024-07-14 | Discharge: 2024-07-14 | Disposition: A | Discharge status: Home / Self Care | Source: Ambulatory Visit | Attending: Cardiology | Admitting: Cardiology

## 2024-07-14 DIAGNOSIS — R931 Abnormal findings on diagnostic imaging of heart and coronary circulation: Secondary | ICD-10-CM | POA: Diagnosis not present

## 2024-07-14 LAB — MYOCARDIAL PERFUSION IMAGING
Angina Index: 0
Base ST Depression (mm): 0 mm
Duke Treadmill Score: 5
Estimated workload: 6.6
Exercise duration (min): 5 min
LV dias vol: 75 mL (ref 62–150)
LV sys vol: 26 mL (ref 4.2–5.8)
MPHR: 156 {beats}/min
Nuc Stress EF: 65 %
Peak HR: 150 {beats}/min
Percent HR: 96 %
Rest HR: 56 {beats}/min
Rest Nuclear Isotope Dose: 10.4 mCi
SDS: 0
SRS: 0
SSS: 0
ST Depression (mm): 0 mm
Stress Nuclear Isotope Dose: 32.6 mCi
TID: 1

## 2024-07-14 MED ORDER — TECHNETIUM TC 99M TETROFOSMIN IV KIT
10.4000 | PACK | Freq: Once | INTRAVENOUS | Status: AC | PRN
  Administered 2024-07-14 (×2): 10.4 via INTRAVENOUS

## 2024-07-14 MED ORDER — TECHNETIUM TC 99M TETROFOSMIN IV KIT
32.6000 | PACK | Freq: Once | INTRAVENOUS | Status: AC | PRN
  Administered 2024-07-14 (×2): 32.6 via INTRAVENOUS

## 2024-07-17 ENCOUNTER — Ambulatory Visit: Payer: Self-pay | Admitting: Cardiology
